# Patient Record
Sex: Female | Born: 1995 | Race: White | Marital: Single | Smoking: Never smoker
Health system: Southern US, Community
[De-identification: ages and names within clinical notes are randomized; demographics above are authoritative.]

---

## 2018-12-02 ENCOUNTER — Ambulatory Visit (HOSPITAL_COMMUNITY)
Admission: EM | Admit: 2018-12-02 | Discharge: 2018-12-02 | Disposition: A | Discharge status: Home / Self Care | Payer: PRIVATE HEALTH INSURANCE | Attending: Emergency Medicine | Admitting: Emergency Medicine

## 2018-12-02 ENCOUNTER — Other Ambulatory Visit: Payer: Self-pay

## 2018-12-02 ENCOUNTER — Encounter (HOSPITAL_COMMUNITY): Payer: Self-pay

## 2018-12-02 DIAGNOSIS — H6983 Other specified disorders of Eustachian tube, bilateral: Secondary | ICD-10-CM

## 2018-12-02 MED ORDER — GUAIFENESIN ER 600 MG PO TB12: 1200.0000 mg | ORAL_TABLET | Freq: Two times a day (BID) | ORAL | 0 refills | Status: DC | PRN

## 2018-12-02 NOTE — ED Triage Notes (Signed)
Pt present that her symptoms is not really dizziness but when she is sitting or laying she feeling like her body is still in motion.

## 2018-12-02 NOTE — ED Provider Notes (Signed)
[place]    CSN: [id number] Arrival date & time: 12/02/18  1316      History   Chief Complaint Chief Complaint  Patient presents with  . Dizziness    HPI Haley Dean is a 23 y.o. female.   Patient presents with a feeling of motion when she lies down.  She describes it as "the feeling you get when you step off a boat".  She denies dizziness, headache, weakness, ear pain, sore throat, cough, congestion, shortness of breath, or other symptoms she states she feels well other than this symptom.  LMP: 11/16/2018.  The history is provided by the patient.    History reviewed. No pertinent past medical history.  There are no active problems to display for this patient.   History reviewed. No pertinent surgical history.  OB History   No obstetric history on file.      Home Medications    Prior to Admission medications   Medication Sig Start Date End Date Taking? Authorizing Provider  guaiFENesin (MUCINEX) 600 MG 12 hr tablet Take 2 tablets (1,200 mg total) by mouth 2 (two) times daily as needed. 12/02/18   Sharion Balloon, NP    Family History History reviewed. No pertinent family history.  Social History Social History   Tobacco Use  . Smoking status: Never Smoker  . Smokeless tobacco: Never Used  Substance Use Topics  . Alcohol use: Not on file  . Drug use: Not on file     Allergies   Amoxicillin   Review of Systems Review of Systems  Constitutional: Negative for chills and fever.  HENT: Negative for congestion, ear pain, rhinorrhea and sore throat.   Eyes: Negative for pain and visual disturbance.  Respiratory: Negative for cough and shortness of breath.   Cardiovascular: Negative for chest pain and palpitations.  Gastrointestinal: Negative for abdominal pain and vomiting.  Genitourinary: Negative for dysuria and hematuria.  Musculoskeletal: Negative for arthralgias and back pain.  Skin: Negative for color change and rash.   Neurological: Negative for dizziness, seizures, syncope, speech difficulty, weakness, numbness and headaches.  All other systems reviewed and are negative.    Physical Exam Triage Vital Signs ED Triage Vitals  Enc Vitals Group     BP 12/02/18 1355 116/81     Pulse Rate 12/02/18 1355 92     Resp 12/02/18 1355 16     Temp 12/02/18 1355 98.7 F (37.1 C)     Temp Source 12/02/18 1355 Oral     SpO2 12/02/18 1355 100 %     Weight --      Height --      Head Circumference --      Peak Flow --      Pain Score 12/02/18 1356 0     Pain Loc --      Pain Edu? --      Excl. in [place]? --    No data found.  Updated Vital Signs BP 116/81 (BP Location: Left Arm)   Pulse 92   Temp 98.7 F (37.1 C) (Oral)   Resp 16   LMP 11/16/2018   SpO2 100%   Visual Acuity Right Eye Distance:   Left Eye Distance:   Bilateral Distance:    Right Eye Near:   Left Eye Near:    Bilateral Near:     Physical Exam Vitals signs and nursing note reviewed.  Constitutional:      General: She is not in acute distress.  Appearance: She is well-developed.  HENT:     Head: Normocephalic and atraumatic.     Right Ear: Tympanic membrane and ear canal normal.     Left Ear: Tympanic membrane and ear canal normal.     Ears:     Comments: Fluid bubbles behind both TMs.     Nose: Nose normal.     Mouth/Throat:     Mouth: Mucous membranes are moist.     Pharynx: Oropharynx is clear.  Eyes:     Conjunctiva/sclera: Conjunctivae normal.  Neck:     Musculoskeletal: Neck supple.  Cardiovascular:     Rate and Rhythm: Normal rate and regular rhythm.     Heart sounds: No murmur.  Pulmonary:     Effort: Pulmonary effort is normal. No respiratory distress.     Breath sounds: Normal breath sounds.  Abdominal:     General: Bowel sounds are normal.     Palpations: Abdomen is soft.     Tenderness: There is no abdominal tenderness. There is no guarding or rebound.  Skin:    General: Skin is warm and dry.      Findings: No rash.  Neurological:     General: No focal deficit present.     Mental Status: She is alert and oriented to person, place, and time.     Cranial Nerves: No cranial nerve deficit.     Sensory: No sensory deficit.     Motor: No weakness.     Coordination: Coordination normal.     Deep Tendon Reflexes: Reflexes normal.  Psychiatric:        Mood and Affect: Mood normal.        Behavior: Behavior normal.      UC Treatments / Results  Labs (all labs ordered are listed, but only abnormal results are displayed) Labs Reviewed - No data to display  EKG   Radiology No results found.  Procedures Procedures (including critical care time)  Medications Ordered in UC Medications - No data to display  Initial Impression / Assessment and Plan / UC Course  I have reviewed the triage vital signs and the nursing notes.  Pertinent labs & imaging results that were available during my care of the patient were reviewed by me and considered in my medical decision making (see chart for details).    Eustachian tube dysfunction.  Treating with Mucinex.  Discussed safety concerns for changing positions with patient.  Instructed her to return here or follow-up with her PCP if her symptoms are not improving or she develops new symptoms such as frank dizziness, weakness, headaches, ear pain, congestion, or other concerns.  Patient agrees to plan of care.     Final Clinical Impressions(s) / UC Diagnoses   Final diagnoses:  Dysfunction of both eustachian tubes     Discharge Instructions     Take the Mucinex as directed.    Change positions slowly, especially when standing up.    Return here or follow-up with your primary care provider if your symptoms are not improving; or you develop new symptoms such as dizziness, headaches, ear pain, congestion, or other concerns.        ED Prescriptions    Medication Sig Dispense Auth. Provider   guaiFENesin (MUCINEX) 600 MG 12 hr tablet  Take 2 tablets (1,200 mg total) by mouth 2 (two) times daily as needed. 12 tablet Mickie Bail, NP     PDMP not reviewed this encounter.   Mickie Bail, NP 12/02/18 1438

## 2018-12-02 NOTE — Discharge Instructions (Signed)
Take the Mucinex as directed.    Change positions slowly, especially when standing up.    Return here or follow-up with your primary care provider if your symptoms are not improving; or you develop new symptoms such as dizziness, headaches, ear pain, congestion, or other concerns.

## 2018-12-19 ENCOUNTER — Ambulatory Visit: Payer: PRIVATE HEALTH INSURANCE | Admitting: Internal Medicine

## 2018-12-21 ENCOUNTER — Ambulatory Visit: Payer: PRIVATE HEALTH INSURANCE | Admitting: Family Medicine

## 2018-12-21 ENCOUNTER — Other Ambulatory Visit: Payer: Self-pay

## 2018-12-21 ENCOUNTER — Ambulatory Visit (INDEPENDENT_AMBULATORY_CARE_PROVIDER_SITE_OTHER): Payer: PRIVATE HEALTH INSURANCE | Admitting: Family Medicine

## 2018-12-21 ENCOUNTER — Encounter: Payer: Self-pay | Admitting: Family Medicine

## 2018-12-21 VITALS — BP 110/70 | HR 82 | Temp 98.4°F | Ht 61.0 in | Wt 153.6 lb

## 2018-12-21 DIAGNOSIS — Z23 Encounter for immunization: Secondary | ICD-10-CM | POA: Diagnosis not present

## 2018-12-21 DIAGNOSIS — H6983 Other specified disorders of Eustachian tube, bilateral: Secondary | ICD-10-CM

## 2018-12-21 NOTE — Progress Notes (Signed)
Haley Dean is a 23 y.o. female  Chief Complaint  Patient presents with  . Establish Care    pt been having some slight pain in boths ears at different times.    HPI: Haley Dean is a 23 y.o. female to establish care with our office. She moved from [place] in 10/2018 to start grad school at [employer].  She complains of B/L ear pain that is intermittent, lasts a few min. She states it feels like her ears need to pop but won't. occasional lightheadedness. Symptoms x few weeks.  No headache. No fever, chills. No nasal congestion, runny nose. No ear drainage.  She was seen at [facility] for this and was recommended to take mucinex. Pt did this for 5 days and felt like symptoms were improving but then she stopped the med.    No past medical history on file.  No past surgical history on file.  Social History   Socioeconomic History  . Marital status: Unknown    Spouse name: Not on file  . Number of children: Not on file  . Years of education: Not on file  . Highest education level: Not on file  Occupational History  . Not on file  Social Needs  . Financial resource strain: Not on file  . Food insecurity    Worry: Not on file    Inability: Not on file  . Transportation needs    Medical: Not on file    Non-medical: Not on file  Tobacco Use  . Smoking status: Never Smoker  . Smokeless tobacco: Never Used  Substance and Sexual Activity  . Alcohol use: Yes    Alcohol/week: 1.0 standard drinks    Types: 1 Glasses of wine per week  . Drug use: Never  . Sexual activity: Not Currently  Lifestyle  . Physical activity    Days per week: Not on file    Minutes per session: Not on file  . Stress: Not on file  Relationships  . Social Herbalist on phone: Not on file    Gets together: Not on file    Attends religious service: Not on file    Active member of club or organization: Not on file    Attends meetings of clubs or organizations: Not on file    Relationship  status: Not on file  . Intimate partner violence    Fear of current or ex partner: Not on file    Emotionally abused: Not on file    Physically abused: Not on file    Forced sexual activity: Not on file  Other Topics Concern  . Not on file  Social History Narrative  . Not on file    No family history on file.   Immunization History  Administered Date(s) Administered  . Influenza,inj,Quad PF,6+ Mos 12/21/2018    Outpatient Encounter Medications as of 12/21/2018  Medication Sig  . guaiFENesin (MUCINEX) 600 MG 12 hr tablet Take 2 tablets (1,200 mg total) by mouth 2 (two) times daily as needed. (Patient not taking: Reported on 12/21/2018)   No facility-administered encounter medications on file as of 12/21/2018.      ROS: Pertinent positives and negatives noted in HPI. Remainder of ROS non-contributory  Allergies  Allergen Reactions  . Amoxicillin Rash    BP 110/70   Pulse 82   Temp 98.4 F (36.9 C) (Oral)   Ht 5\' 1"  (1.549 m)   Wt 153 lb 9.6 oz (69.7 kg)   SpO2  98%   BMI 29.02 kg/m   Physical Exam  Constitutional: She appears well-developed and well-nourished. No distress.  HENT:  Head: Normocephalic and atraumatic.  Right Ear: Tympanic membrane, external ear and ear canal normal. No drainage. Right ear middle ear effusion: serous effusion B/L Rt>Lt.  Left Ear: Tympanic membrane, external ear and ear canal normal. No drainage.  Nose: Nose normal. Right sinus exhibits no maxillary sinus tenderness and no frontal sinus tenderness. Left sinus exhibits no maxillary sinus tenderness and no frontal sinus tenderness.  Mouth/Throat: Oropharynx is clear and moist and mucous membranes are normal.  Neck: Neck supple.  Lymphadenopathy:    She has no cervical adenopathy.     A/P:  1. Eustachian tube dysfunction, bilateral - flonase BID x 10-14 days - sudafed daily x 3-5 days - f/u if symptoms worsen or do not improve in 2 wks Discussed plan and reviewed medications  with patient, including risks, benefits, and potential side effects. Pt expressed understand. All questions answered.

## 2018-12-21 NOTE — Patient Instructions (Signed)
Sudafed x 3-5 days Flonase 2 sprays each side of your nose twice per day for 10-14 days

## 2018-12-27 ENCOUNTER — Ambulatory Visit: Payer: PRIVATE HEALTH INSURANCE | Admitting: Family Medicine

## 2018-12-27 ENCOUNTER — Encounter: Payer: Self-pay | Admitting: Family Medicine

## 2018-12-27 DIAGNOSIS — H6983 Other specified disorders of Eustachian tube, bilateral: Secondary | ICD-10-CM

## 2018-12-27 MED ORDER — PREDNISONE 20 MG PO TABS: 40.0000 mg | ORAL_TABLET | Freq: Every day | ORAL | 0 refills | Status: AC

## 2019-01-02 ENCOUNTER — Other Ambulatory Visit: Payer: Self-pay

## 2019-01-02 ENCOUNTER — Other Ambulatory Visit: Payer: Self-pay | Admitting: Family Medicine

## 2019-01-02 DIAGNOSIS — H6983 Other specified disorders of Eustachian tube, bilateral: Secondary | ICD-10-CM

## 2019-01-02 DIAGNOSIS — H6993 Unspecified Eustachian tube disorder, bilateral: Secondary | ICD-10-CM

## 2019-01-02 MED ORDER — MECLIZINE HCL 25 MG PO TABS: 25.0000 mg | ORAL_TABLET | Freq: Three times a day (TID) | ORAL | 0 refills | Status: DC | PRN

## 2019-02-28 ENCOUNTER — Encounter: Payer: Self-pay | Admitting: Family Medicine

## 2019-03-15 ENCOUNTER — Encounter: Payer: Self-pay | Admitting: Family Medicine

## 2019-03-15 DIAGNOSIS — R519 Headache, unspecified: Secondary | ICD-10-CM

## 2019-03-15 DIAGNOSIS — R42 Dizziness and giddiness: Secondary | ICD-10-CM

## 2019-03-19 ENCOUNTER — Other Ambulatory Visit: Payer: Self-pay

## 2019-03-19 ENCOUNTER — Other Ambulatory Visit (INDEPENDENT_AMBULATORY_CARE_PROVIDER_SITE_OTHER): Payer: PRIVATE HEALTH INSURANCE

## 2019-03-19 DIAGNOSIS — R519 Headache, unspecified: Secondary | ICD-10-CM | POA: Diagnosis not present

## 2019-03-19 DIAGNOSIS — R42 Dizziness and giddiness: Secondary | ICD-10-CM | POA: Diagnosis not present

## 2019-03-19 LAB — CBC WITH DIFFERENTIAL/PLATELET
Basophils Absolute: 0.1 10*3/uL (ref 0.0–0.1)
Basophils Relative: 0.5 % (ref 0.0–3.0)
Eosinophils Absolute: 0.1 10*3/uL (ref 0.0–0.7)
Eosinophils Relative: 0.6 % (ref 0.0–5.0)
HCT: 38.8 % (ref 36.0–46.0)
Hemoglobin: 12.9 g/dL (ref 12.0–15.0)
Lymphocytes Relative: 20.8 % (ref 12.0–46.0)
Lymphs Abs: 2.2 10*3/uL (ref 0.7–4.0)
MCHC: 33.4 g/dL (ref 30.0–36.0)
MCV: 92.6 fl (ref 78.0–100.0)
Monocytes Absolute: 0.5 10*3/uL (ref 0.1–1.0)
Monocytes Relative: 4.9 % (ref 3.0–12.0)
Neutro Abs: 7.6 10*3/uL (ref 1.4–7.7)
Neutrophils Relative %: 73.2 % (ref 43.0–77.0)
Platelets: 295 10*3/uL (ref 150.0–400.0)
RBC: 4.19 Mil/uL (ref 3.87–5.11)
RDW: 12.9 % (ref 11.5–15.5)
WBC: 10.4 10*3/uL (ref 4.0–10.5)

## 2019-03-19 LAB — BASIC METABOLIC PANEL
BUN: 12 mg/dL (ref 6–23)
CO2: 26 mEq/L (ref 19–32)
Calcium: 9.5 mg/dL (ref 8.4–10.5)
Chloride: 103 mEq/L (ref 96–112)
Creatinine, Ser: 0.64 mg/dL (ref 0.40–1.20)
GFR: 115.04 mL/min (ref >60.00)
Glucose, Bld: 86 mg/dL (ref 70–99)
Potassium: 3.8 mEq/L (ref 3.5–5.1)
Sodium: 138 mEq/L (ref 135–145)

## 2019-03-19 LAB — VITAMIN B12: Vitamin B-12: 538 pg/mL (ref 211–911)

## 2019-03-19 LAB — TSH: TSH: 0.58 u[IU]/mL (ref 0.35–4.50)

## 2019-03-20 LAB — IRON,TIBC AND FERRITIN PANEL
%SAT: 31 % (calc) (ref 16–45)
Ferritin: 51 ng/mL (ref 16–154)
Iron: 96 ug/dL (ref 40–190)
TIBC: 310 mcg/dL (calc) (ref 250–450)

## 2019-03-21 ENCOUNTER — Telehealth (INDEPENDENT_AMBULATORY_CARE_PROVIDER_SITE_OTHER): Payer: PRIVATE HEALTH INSURANCE | Admitting: Family Medicine

## 2019-03-21 ENCOUNTER — Encounter: Payer: Self-pay | Admitting: Family Medicine

## 2019-03-21 VITALS — Temp 97.9°F

## 2019-03-21 DIAGNOSIS — R12 Heartburn: Secondary | ICD-10-CM | POA: Diagnosis not present

## 2019-03-21 DIAGNOSIS — R42 Dizziness and giddiness: Secondary | ICD-10-CM

## 2019-03-21 NOTE — Progress Notes (Signed)
Virtual Visit via Video Note  I connected with Haley Dean on 03/21/19 at  2:00 PM EST by a video enabled telemedicine application and verified that I am speaking with the correct person using two identifiers. Location patient: home Location provider: work or home office Persons participating in the virtual visit: patient, provider  I discussed the limitations of evaluation and management by telemedicine and the availability of in person appointments. The patient expressed understanding and agreed to proceed.  Chief Complaint  Patient presents with  . Heartburn    Patient agrees to virtual visit. She is C/O heartburn that started on Saturday.  It dissapated and returned on Monday and stayed into Tuesday. She states that she has burning in epigastric area that intermittently went through to her back. She denies neck, jaw, arm pain but has had lightheadedness since September. Had some heartburn prior to Christmas and she Tx with Prevacid and it seemed to help.     HPI: Haley Dean is a 24 y.o. female with heartburn - burning sensation is upper abdomen and lower chest - that has occurred intermittently over the past 4 days. She had similar episode just prior to Christmas. No n/v/d/c. No fever, chills. She cannot identify a specific trigger for the event. She took a few doses of prevacid in 02/2019 and that seemed to be effective. She tried pepto-bismol without much change.   Pt continues to have lightheadedness that is intermittent, occurs at rest (not with movement, position change, ambulation). Symptoms began 4 mo ago. 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 It can also occur while she is seated watching TV. No associated symptoms - denies HA, n/v, vision changes, CP, SOB, palpitations. She does wear glasses and had eye exam in 07/2018. She gets 8+ hrs of sleep per night, eats well, and  feels she drinks plenty of water. Symptoms were thought to be d/t eustachian tube dysfunction but pt has been using flonase, saw ENT and audiology, and was told symptoms are not ENT related. Pt is concerned about cause of symptoms.    No past medical history on file.  No past surgical history on file.  No family history on file.  Social History   Tobacco Use  . Smoking status: Never Smoker  . Smokeless tobacco: Never Used  Substance Use Topics  . Alcohol use: Yes    Alcohol/week: 1.0 standard drinks    Types: 1 Glasses of wine per week  . Drug use: Never     Current Outpatient Medications:  .  fluticasone (FLONASE) 50 MCG/ACT nasal spray, Place into the nose., Disp: , Rfl:   Allergies  Allergen Reactions  . Amoxicillin Rash      ROS: See pertinent positives and negatives per HPI.   EXAM:  VITALS per patient if applicable: Temp 97.9 F (36.6 C) (Oral)   LMP 03/07/2019     GENERAL: alert, oriented, appears well and in no acute distress  HEENT: atraumatic, conjunctiva clear, no obvious abnormalities on inspection of external nose and ears  NECK: normal movements of the head and neck  LUNGS: on inspection no signs of respiratory distress, breathing rate appears normal, no obvious gross SOB, gasping or wheezing, no conversational dyspnea  CV: no obvious cyanosis  PSYCH/NEURO: pleasant and cooperative,  speech and thought processing grossly intact   ASSESSMENT AND PLAN:  1. Heartburn - episodic, no red flag symptoms -  keep food log to try to identify trigger foods - PRN antacid or daily PPI x 1-2 wks if antacid not effective - f/u PRN  2. Lightheadedness 3. Dizziness, nonspecific - consistent symptoms x 4 mo - VAS US CAROTID; Future - CT Head Wo Contrast; Future - CT Maxillofacial WO CM; Future - normal BMP, TSH, CBC, iron panel, B12 - ENT/audiology eval - normal - vision WNL and eye exam UTD; appropriate sleep, hydration, nutrition - could be stress  related but will r/o CV, neuro pathology    I discussed the assessment and treatment plan with the patient. The patient was provided an opportunity to ask questions and all were answered. The patient agreed with the plan and demonstrated an understanding of the instructions.   The patient was advised to call back or seek an in-person evaluation if the symptoms worsen or if the condition fails to improve as anticipated.   Letta Median, DO

## 2019-03-22 ENCOUNTER — Other Ambulatory Visit: Payer: Self-pay

## 2019-03-22 ENCOUNTER — Ambulatory Visit (HOSPITAL_COMMUNITY)
Admission: RE | Admit: 2019-03-22 | Discharge: 2019-03-22 | Disposition: A | Discharge status: Home / Self Care | Payer: PRIVATE HEALTH INSURANCE | Source: Ambulatory Visit | Attending: Cardiology | Admitting: Cardiology

## 2019-03-22 DIAGNOSIS — R42 Dizziness and giddiness: Secondary | ICD-10-CM | POA: Diagnosis not present

## 2019-03-29 ENCOUNTER — Other Ambulatory Visit: Payer: Self-pay | Admitting: Family Medicine

## 2019-03-30 ENCOUNTER — Ambulatory Visit
Admission: RE | Admit: 2019-03-30 | Discharge: 2019-03-30 | Disposition: A | Discharge status: Home / Self Care | Payer: PRIVATE HEALTH INSURANCE | Source: Ambulatory Visit | Attending: Family Medicine | Admitting: Family Medicine

## 2019-03-30 DIAGNOSIS — R42 Dizziness and giddiness: Secondary | ICD-10-CM

## 2019-04-02 ENCOUNTER — Encounter: Payer: Self-pay | Admitting: Family Medicine

## 2019-04-04 ENCOUNTER — Other Ambulatory Visit: Payer: Self-pay | Admitting: Family Medicine

## 2019-04-04 DIAGNOSIS — H6983 Other specified disorders of Eustachian tube, bilateral: Secondary | ICD-10-CM

## 2019-04-04 DIAGNOSIS — R42 Dizziness and giddiness: Secondary | ICD-10-CM

## 2019-04-13 ENCOUNTER — Ambulatory Visit (INDEPENDENT_AMBULATORY_CARE_PROVIDER_SITE_OTHER): Payer: PRIVATE HEALTH INSURANCE | Admitting: Otolaryngology

## 2019-04-13 ENCOUNTER — Other Ambulatory Visit: Payer: Self-pay

## 2019-04-13 ENCOUNTER — Encounter (INDEPENDENT_AMBULATORY_CARE_PROVIDER_SITE_OTHER): Payer: Self-pay | Admitting: Otolaryngology

## 2019-04-13 VITALS — Temp 97.9°F

## 2019-04-13 DIAGNOSIS — R2689 Other abnormalities of gait and mobility: Secondary | ICD-10-CM | POA: Diagnosis not present

## 2019-04-13 DIAGNOSIS — H6983 Other specified disorders of Eustachian tube, bilateral: Secondary | ICD-10-CM

## 2019-04-13 NOTE — Progress Notes (Signed)
HPI: Haley Dean is a 24 y.o. female who presents is referred by Dr. Percell Belt For evaluation of "eustachian tube dysfunction".  Patient has complained of chronic ear pressure since September.  She also states that she has, lightheadedness but no real vertigo or spinning.  She says her ears will not pop.  She has seen another ENT and had audiogram which was normal as well as a CT scan of her head which was normal.  She has had this previously but only lasted for a few weeks.  Her present symptoms have been lasting for several months and she still describes pressure or fullness in the ear and some lightheadedness.  But she denies any vertigo or spinning sensation.  She does not have any difficulty hearing.  No ear pain.  No past medical history on file. No past surgical history on file. Social History   Socioeconomic History  . Marital status: Single    Spouse name: Not on file  . Number of children: Not on file  . Years of education: Not on file  . Highest education level: Not on file  Occupational History  . Not on file  Tobacco Use  . Smoking status: Never Smoker  . Smokeless tobacco: Never Used  Substance and Sexual Activity  . Alcohol use: Yes    Alcohol/week: 1.0 standard drinks    Types: 1 Glasses of wine per week  . Drug use: Never  . Sexual activity: Not Currently  Other Topics Concern  . Not on file  Social History Narrative  . Not on file   Social Determinants of Health   Financial Resource Strain:   . Difficulty of Paying Living Expenses: Not on file  Food Insecurity:   . Worried About Programme researcher, broadcasting/film/video in the Last Year: Not on file  . Ran Out of Food in the Last Year: Not on file  Transportation Needs:   . Lack of Transportation (Medical): Not on file  . Lack of Transportation (Non-Medical): Not on file  Physical Activity:   . Days of Exercise per Week: Not on file  . Minutes of Exercise per Session: Not on file  Stress:   . Feeling of Stress : Not on  file  Social Connections:   . Frequency of Communication with Friends and Family: Not on file  . Frequency of Social Gatherings with Friends and Family: Not on file  . Attends Religious Services: Not on file  . Active Member of Clubs or Organizations: Not on file  . Attends Banker Meetings: Not on file  . Marital Status: Not on file   No family history on file. Allergies  Allergen Reactions  . Amoxicillin Rash   Prior to Admission medications   Medication Sig Start Date End Date Taking? Authorizing Provider  fluticasone (FLONASE) 50 MCG/ACT nasal spray Place into the nose. 01/12/19 01/12/20 Yes [provider]     Positive ROS: Otherwise negative  All other systems have been reviewed and were otherwise negative with the exception of those mentioned in the HPI and as above.  Physical Exam: Constitutional: Alert, well-appearing, no acute distress Ears: External ears without lesions or tenderness. Ear canals are clear bilaterally.  TMs are clear bilaterally with good mobility on pneumatic otoscopy.  No middle ear fluid noted.  On hearing screening with a 512 1024 tuning fork she heard about the same in both ears with AC > BC bilaterally. Nasal: External nose without lesions. Septum with minimal deformity.  Mild rhinitis.  Both middle meatus regions are clear with no signs of infection..  Nasopharynx appears clear. Oral: Lips and gums without lesions. Tongue and palate mucosa without lesions. Posterior oropharynx clear. Neck: No palpable adenopathy or masses.  No obvious TMJ abnormality noted on palpation. Respiratory: Breathing comfortably  Skin: No facial/neck lesions or rash noted.  Procedures  Assessment: Questionable eustachian tube dysfunction.  Normal clinical exam of ears. I suspect the symptoms will gradually resolve or spontaneously resolve without specific therapy although I would recommend continuation with the nasal steroid spray as she has mild  rhinitis.  Plan: I discussed with the patient concerning present clinical findings.  Would recommend continuation with the Flonase. If symptoms with imbalance worsen would recommend further evaluation with neurologist.   Radene Journey, MD   CC:

## 2019-04-16 ENCOUNTER — Encounter (INDEPENDENT_AMBULATORY_CARE_PROVIDER_SITE_OTHER): Payer: Self-pay

## 2019-04-20 ENCOUNTER — Encounter: Payer: Self-pay | Admitting: Family Medicine

## 2019-05-28 ENCOUNTER — Encounter: Payer: Self-pay | Admitting: Family Medicine

## 2019-11-30 ENCOUNTER — Encounter: Payer: Self-pay | Admitting: Family Medicine

## 2019-11-30 ENCOUNTER — Other Ambulatory Visit: Payer: Self-pay

## 2019-11-30 ENCOUNTER — Ambulatory Visit (INDEPENDENT_AMBULATORY_CARE_PROVIDER_SITE_OTHER): Payer: PRIVATE HEALTH INSURANCE | Admitting: Family Medicine

## 2019-11-30 VITALS — BP 108/70 | HR 69 | Temp 98.3°F | Ht 61.0 in | Wt 146.8 lb

## 2019-11-30 DIAGNOSIS — F411 Generalized anxiety disorder: Secondary | ICD-10-CM | POA: Diagnosis not present

## 2019-11-30 DIAGNOSIS — Z23 Encounter for immunization: Secondary | ICD-10-CM

## 2019-11-30 MED ORDER — ESCITALOPRAM OXALATE 10 MG PO TABS: ORAL_TABLET | ORAL | 1 refills | Status: DC

## 2019-11-30 NOTE — Progress Notes (Signed)
Haley Dean is a 24 y.o. female  Chief Complaint  Patient presents with  . Anxiety    anxiety symptoms, was recommended by therapist to f/u with PCP,  having issues eating/vommiting ; wants flu shot today    HPI: Haley Dean is a 24 y.o. female who is seen today to discuss anxiety and possible medication options to better manage her symptoms. She is seeing a therapist - counseling center at [facility] - who recommended she see PCP to discuss meds. Pt had generalized anxiety and at times this manifests physically as GI upset, decreased appetite, vomiting. She also has lightheadedness at times. Majority of stress related to family, sister with mental health issues but unwilling to seek treatment.   History reviewed. No pertinent past medical history.  History reviewed. No pertinent surgical history.  Social History   Socioeconomic History  . Marital status: Single    Spouse name: Not on file  . Number of children: Not on file  . Years of education: Not on file  . Highest education level: Not on file  Occupational History  . Not on file  Tobacco Use  . Smoking status: Never Smoker  . Smokeless tobacco: Never Used  Vaping Use  . Vaping Use: Never used  Substance and Sexual Activity  . Alcohol use: Yes    Alcohol/week: 1.0 standard drink    Types: 1 Glasses of wine per week  . Drug use: Never  . Sexual activity: Not Currently  Other Topics Concern  . Not on file  Social History Narrative  . Not on file   Social Determinants of Health   Financial Resource Strain:   . Difficulty of Paying Living Expenses: Not on file  Food Insecurity:   . Worried About Programme researcher, broadcasting/film/video in the Last Year: Not on file  . Ran Out of Food in the Last Year: Not on file  Transportation Needs:   . Lack of Transportation (Medical): Not on file  . Lack of Transportation (Non-Medical): Not on file  Physical Activity:   . Days of Exercise per Week: Not on file  . Minutes of Exercise  per Session: Not on file  Stress:   . Feeling of Stress : Not on file  Social Connections:   . Frequency of Communication with Friends and Family: Not on file  . Frequency of Social Gatherings with Friends and Family: Not on file  . Attends Religious Services: Not on file  . Active Member of Clubs or Organizations: Not on file  . Attends Banker Meetings: Not on file  . Marital Status: Not on file  Intimate Partner Violence:   . Fear of Current or Ex-Partner: Not on file  . Emotionally Abused: Not on file  . Physically Abused: Not on file  . Sexually Abused: Not on file    History reviewed. No pertinent family history.   Immunization History  Administered Date(s) Administered  . Influenza,inj,Quad PF,6+ Mos 12/21/2018  . Janssen (J&J) SARS-COV-2 Vaccination 05/17/2019    Outpatient Encounter Medications as of 11/30/2019  Medication Sig  . Fexofenadine HCl (ALLEGRA ALLERGY PO) Take by mouth.  . fluticasone (FLONASE) 50 MCG/ACT nasal spray Place into the nose. (Patient not taking: Reported on 11/30/2019)   No facility-administered encounter medications on file as of 11/30/2019.     ROS: Pertinent positives and negatives noted in HPI. Remainder of ROS non-contributory    Allergies  Allergen Reactions  . Amoxicillin Rash    BP 108/70  Pulse 69   Temp 98.3 F (36.8 C) (Temporal)   Ht 5\' 1"  (1.549 m)   Wt 146 lb 12.8 oz (66.6 kg)   SpO2 96%   BMI 27.74 kg/m   Physical Exam Constitutional:      General: She is not in acute distress.    Appearance: Normal appearance. She is not ill-appearing.  Pulmonary:     Effort: No respiratory distress.  Neurological:     Mental Status: She is alert and oriented to person, place, and time.  Psychiatric:        Mood and Affect: Mood normal.        Behavior: Behavior normal.      A/P:  1. GAD (generalized anxiety disorder) - cont with regular BH counseling thru [facility] Rx: - escitalopram (LEXAPRO) 10 MG  tablet; 1/2 tab po daily x 4 days then 1 tab po daily  Dispense: 90 tablet; Refill: 1 - f/u in 3-4 wks or sooner PRN - all questions answered  Discussed plan and reviewed medications with patient, including risks, benefits, and potential side effects. Pt expressed understand. All questions answered.  2. Need for influenza vaccination - Flu Vaccine QUAD 6+ mos PF IM (Fluarix Quad PF)   I spent 30 min with the patient today obtaining HPI, discussing diagnosis and treatment options and f/u.   This visit occurred during the SARS-CoV-2 public health emergency.  Safety protocols were in place, including screening questions prior to the visit, additional usage of staff PPE, and extensive cleaning of exam room while observing appropriate contact time as indicated for disinfecting solutions.

## 2019-12-05 ENCOUNTER — Ambulatory Visit: Payer: PRIVATE HEALTH INSURANCE | Admitting: Family Medicine

## 2019-12-21 ENCOUNTER — Other Ambulatory Visit: Payer: Self-pay

## 2019-12-21 ENCOUNTER — Encounter: Payer: Self-pay | Admitting: Family Medicine

## 2019-12-21 ENCOUNTER — Ambulatory Visit (INDEPENDENT_AMBULATORY_CARE_PROVIDER_SITE_OTHER): Payer: PRIVATE HEALTH INSURANCE | Admitting: Family Medicine

## 2019-12-21 VITALS — BP 110/68 | HR 63 | Temp 98.4°F | Ht 61.0 in | Wt 142.4 lb

## 2019-12-21 DIAGNOSIS — F411 Generalized anxiety disorder: Secondary | ICD-10-CM | POA: Diagnosis not present

## 2019-12-21 MED ORDER — ESCITALOPRAM OXALATE 20 MG PO TABS: 20.0000 mg | ORAL_TABLET | Freq: Every day | ORAL | 3 refills | Status: DC

## 2019-12-21 NOTE — Progress Notes (Signed)
Haley Dean is a 24 y.o. female  Chief Complaint  Patient presents with  . Follow-up    3 week f/u anxiety    HPI: Haley Dean is a 24 y.o. female seen today for f/u on anxiety. She was started on lexapro 10mg  daily on 11/30/19. Today she reports she hasn't noticed much of a difference. She is taking 10mg  daily at bedtime. No side effects.  PHQ-9 = 9, GAD-7 = 9  No past medical history on file.  No past surgical history on file.  Social History   Socioeconomic History  . Marital status: Single    Spouse name: Not on file  . Number of children: Not on file  . Years of education: Not on file  . Highest education level: Not on file  Occupational History  . Not on file  Tobacco Use  . Smoking status: Never Smoker  . Smokeless tobacco: Never Used  Vaping Use  . Vaping Use: Never used  Substance and Sexual Activity  . Alcohol use: Yes    Alcohol/week: 1.0 standard drink    Types: 1 Glasses of wine per week  . Drug use: Never  . Sexual activity: Not Currently  Other Topics Concern  . Not on file  Social History Narrative  . Not on file   Social Determinants of Health   Financial Resource Strain:   . Difficulty of Paying Living Expenses: Not on file  Food Insecurity:   . Worried About 12/02/19 in the Last Year: Not on file  . Ran Out of Food in the Last Year: Not on file  Transportation Needs:   . Lack of Transportation (Medical): Not on file  . Lack of Transportation (Non-Medical): Not on file  Physical Activity:   . Days of Exercise per Week: Not on file  . Minutes of Exercise per Session: Not on file  Stress:   . Feeling of Stress : Not on file  Social Connections:   . Frequency of Communication with Friends and Family: Not on file  . Frequency of Social Gatherings with Friends and Family: Not on file  . Attends Religious Services: Not on file  . Active Member of Clubs or Organizations: Not on file  . Attends  Meetings: Not on file  . Marital Status: Not on file  Intimate Partner Violence:   . Fear of Current or Ex-Partner: Not on file  . Emotionally Abused: Not on file  . Physically Abused: Not on file  . Sexually Abused: Not on file    No family history on file.   Immunization History  Administered Date(s) Administered  . Influenza,inj,Quad PF,6+ Mos 12/21/2018, 11/30/2019  . Janssen (J&J) SARS-COV-2 Vaccination 05/17/2019    Outpatient Encounter Medications as of 12/21/2019  Medication Sig  . escitalopram (LEXAPRO) 20 MG tablet Take 1 tablet (20 mg total) by mouth daily.  07/17/2019 Fexofenadine HCl (ALLEGRA ALLERGY PO) Take by mouth.  . [DISCONTINUED] escitalopram (LEXAPRO) 10 MG tablet 1/2 tab po daily x 4 days then 1 tab po daily  . [DISCONTINUED] fluticasone (FLONASE) 50 MCG/ACT nasal spray Place into the nose. (Patient not taking: Reported on 11/30/2019)   No facility-administered encounter medications on file as of 12/21/2019.     ROS: Pertinent positives and negatives noted in HPI. Remainder of ROS non-contributory    Allergies  Allergen Reactions  . Amoxicillin Rash    BP 110/68   Pulse 63   Temp 98.4 F (36.9 C) (Temporal)  Ht 5\' 1"  (1.549 m)   Wt 142 lb 6.4 oz (64.6 kg)   SpO2 98%   BMI 26.91 kg/m   Physical Exam Constitutional:      General: She is not in acute distress.    Appearance: Normal appearance. She is not ill-appearing.  Pulmonary:     Effort: No respiratory distress.  Neurological:     Mental Status: She is alert and oriented to person, place, and time.  Psychiatric:        Mood and Affect: Mood normal.        Behavior: Behavior normal.      A/P:  1. GAD (generalized anxiety disorder) - no noticeable change in symptoms on lexapro 10mg  daily - PHQ-9 = 9, GAD-7 = 9 Increase: - escitalopram (LEXAPRO) 20 MG tablet; Take 1 tablet (20 mg total) by mouth daily.  Dispense: 90 tablet; Refill: 3 - f/u via VV or mychart message in 2 wks or sooner  PRN Discussed plan and reviewed medications with patient, including risks, benefits, and potential side effects. Pt expressed understand. All questions answered.   This visit occurred during the SARS-CoV-2 public health emergency.  Safety protocols were in place, including screening questions prior to the visit, additional usage of staff PPE, and extensive cleaning of exam room while observing appropriate contact time as indicated for disinfecting solutions.

## 2019-12-24 ENCOUNTER — Other Ambulatory Visit: Payer: Self-pay

## 2019-12-24 DIAGNOSIS — F411 Generalized anxiety disorder: Secondary | ICD-10-CM

## 2019-12-24 MED ORDER — ESCITALOPRAM OXALATE 20 MG PO TABS: 20.0000 mg | ORAL_TABLET | Freq: Every day | ORAL | 3 refills | Status: DC

## 2019-12-24 NOTE — Telephone Encounter (Signed)
Contacted pharmacy to verify they didn't received RX then resent to pharmacy due to E-scribe error on 12/21/19.   Dm/cma 

## 2019-12-28 ENCOUNTER — Encounter: Payer: Self-pay | Admitting: Family Medicine

## 2020-01-04 ENCOUNTER — Telehealth (INDEPENDENT_AMBULATORY_CARE_PROVIDER_SITE_OTHER): Payer: PRIVATE HEALTH INSURANCE | Admitting: Family Medicine

## 2020-01-04 ENCOUNTER — Encounter: Payer: Self-pay | Admitting: Family Medicine

## 2020-01-04 VITALS — Ht 61.0 in | Wt 146.0 lb

## 2020-01-04 DIAGNOSIS — T50905A Adverse effect of unspecified drugs, medicaments and biological substances, initial encounter: Secondary | ICD-10-CM

## 2020-01-04 DIAGNOSIS — F411 Generalized anxiety disorder: Secondary | ICD-10-CM

## 2020-01-04 MED ORDER — FLUOXETINE HCL 10 MG PO CAPS: 10.0000 mg | ORAL_CAPSULE | Freq: Every day | ORAL | 1 refills | Status: AC

## 2020-01-04 NOTE — Progress Notes (Signed)
Virtual Visit via Video Note  I connected with Haley Dean on 01/04/20 at  1:00 PM EDT by a video enabled telemedicine application and verified that I am speaking with the correct person using two identifiers. Location patient: home Location provider: work  Persons participating in the virtual visit: patient, provider  I discussed the limitations of evaluation and management by telemedicine and the availability of in person appointments. The patient expressed understanding and agreed to proceed.  Chief Complaint  Patient presents with  . Follow-up    f/u anxiety/meds     HPI: Haley Dean is a 24 y.o. female seen today for f/u on anxiety. Pt is taking lexapro and dose was increased to 20mg  on 12/21/19. Pt experienced lightheadedness on this dose which resolved when she stopped taking the medication. Last dose was about 1 week ago.  Pt has not been on any other med for anxiety in the past.   History reviewed. No pertinent past medical history.  History reviewed. No pertinent surgical history.   History reviewed. No pertinent family history.  Social History   Tobacco Use  . Smoking status: Never Smoker  . Smokeless tobacco: Never Used  Vaping Use  . Vaping Use: Never used  Substance Use Topics  . Alcohol use: Yes    Alcohol/week: 1.0 standard drink    Types: 1 Glasses of wine per week  . Drug use: Never     Current Outpatient Medications:  .  Fexofenadine HCl (ALLEGRA ALLERGY PO), Take by mouth., Disp: , Rfl:  .  FLUoxetine (PROZAC) 10 MG capsule, Take 1 capsule (10 mg total) by mouth daily., Disp: 90 capsule, Rfl: 1  Allergies  Allergen Reactions  . Amoxicillin Rash      ROS: See pertinent positives and negatives per HPI.   EXAM:  VITALS per patient if applicable: Ht 5\' 1"  (1.549 m)   Wt 146 lb (66.2 kg) Comment: pt reported  BMI 27.59 kg/m    GENERAL: alert, oriented, appears well and in no acute distress  HEENT: atraumatic, conjunctiva  clear, no obvious abnormalities on inspection of external nose and ears  NECK: normal movements of the head and neck  LUNGS: on inspection no signs of respiratory distress, breathing rate appears normal, no obvious gross SOB, gasping or wheezing, no conversational dyspnea  CV: no obvious cyanosis  MS: moves all visible extremities without noticeable abnormality  PSYCH/NEURO: pleasant and cooperative, no obvious depression or anxiety, speech and thought processing grossly intact   ASSESSMENT AND PLAN: 1. GAD (generalized anxiety disorder) 2. Medication reaction, initial encounter - pt experienced lightheadedness on lexapro 20mg  daily and 10mg  daily was not very effective. Pt d/c'd 1 week ago Rx: - FLUoxetine (PROZAC) 10 MG capsule; Take 1 capsule (10 mg total) by mouth daily.  Dispense: 90 capsule; Refill: 1 - if pt experiences side effects with prozac will switch away from SSRI and try effexor or cymbalta - pt to f/u with mychart message in 3wks or so Discussed plan and reviewed medications with patient, including risks, benefits, and potential side effects. Pt expressed understand. All questions answered.   I discussed the assessment and treatment plan with the patient. The patient was provided an opportunity to ask questions and all were answered. The patient agreed with the plan and demonstrated an understanding of the instructions.   The patient was advised to call back or seek an in-person evaluation if the symptoms worsen or if the condition fails to improve as anticipated.   12/23/19,  DO

## 2021-11-26 IMAGING — CT CT MAXILLOFACIAL W/O CM
3 of 5 series · 13 of 47 positions shown, 15 images · non-contrast
Comparison: None.

CLINICAL DATA: Dizziness, lightheadedness

EXAM:
CT HEAD WITHOUT CONTRAST
CT MAXILLOFACIAL WITHOUT CONTRAST
TECHNIQUE: Multidetector CT imaging of the head and maxillofacial structures
were performed using the standard protocol without intravenous
contrast. Multiplanar CT image reconstructions of the maxillofacial
structures were also generated.

[Series 2: sinus 2.00 hr60 s3 axial · axial · 0.27mm/px · z∈[-644,-560]mm · 7 of 55 slices shown, 9 images]
[im 7/55  brain]
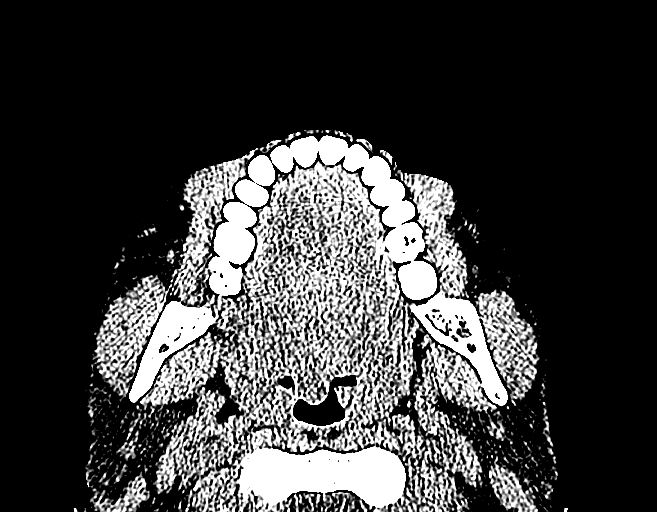
[im 7/55  bone]
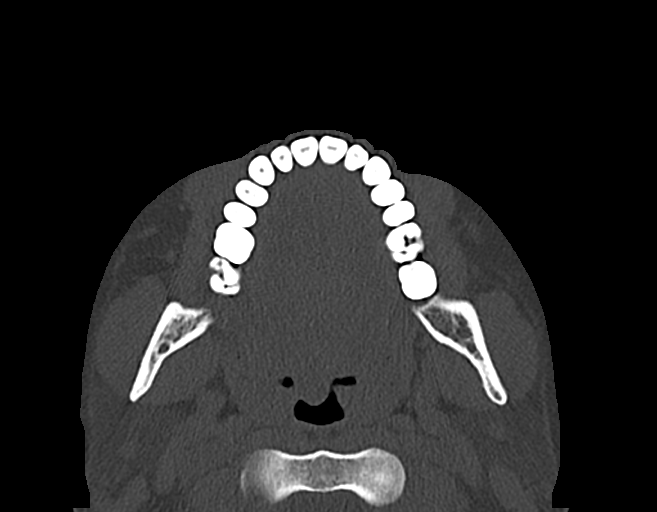
[im 13/55  bone]
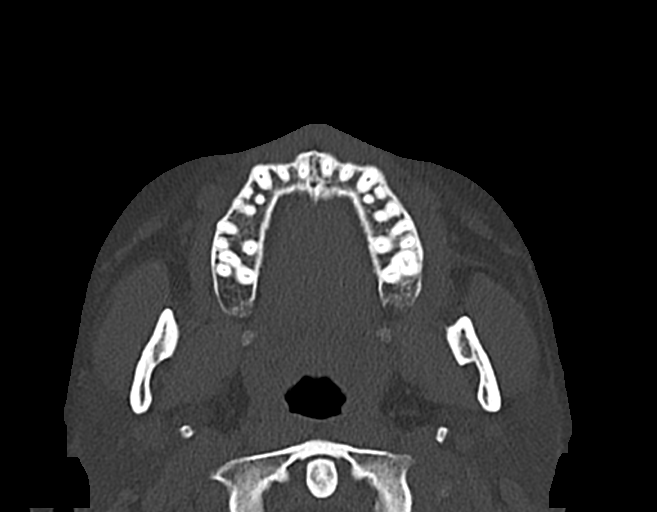
[im 19/55  bone]
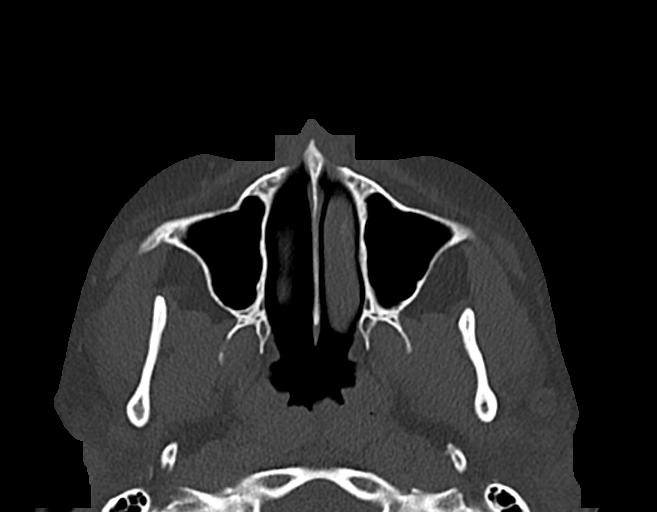
[im 31/55  bone]
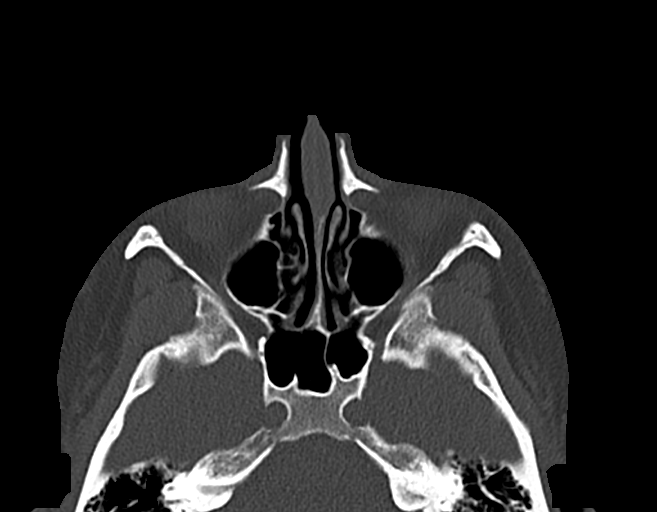
[im 37/55  brain]
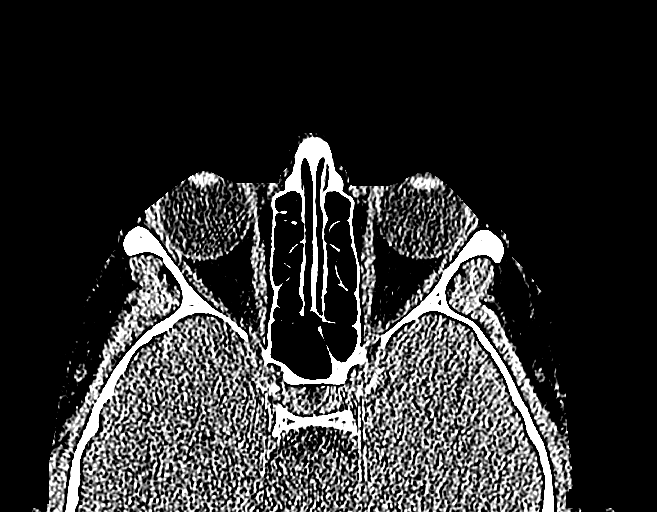
[im 37/55  bone]
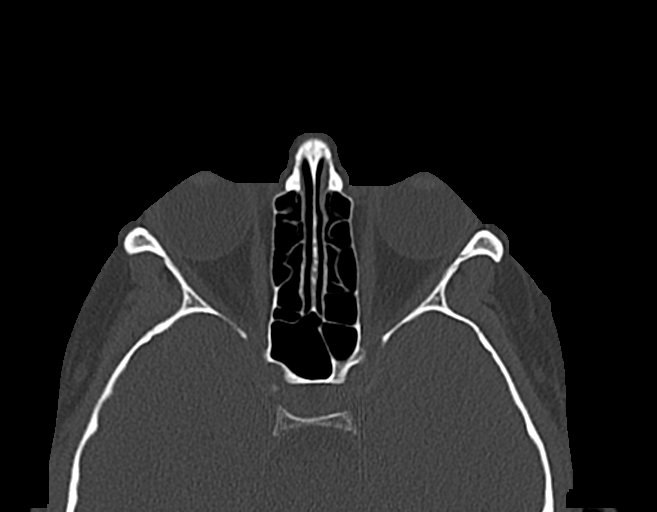
[im 43/55  bone]
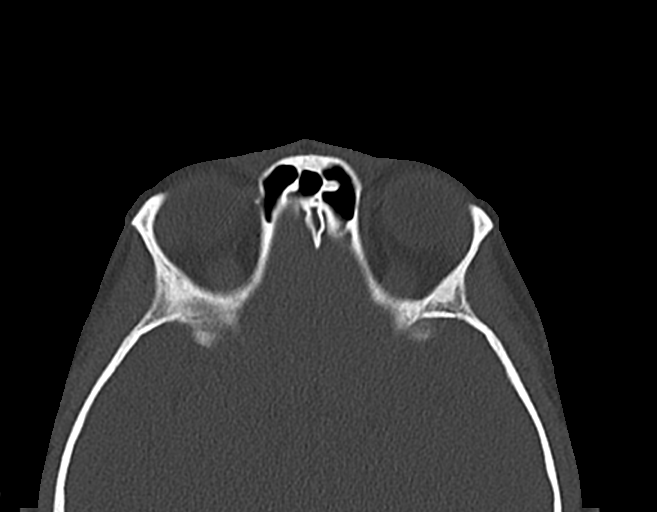
[im 49/55  bone]
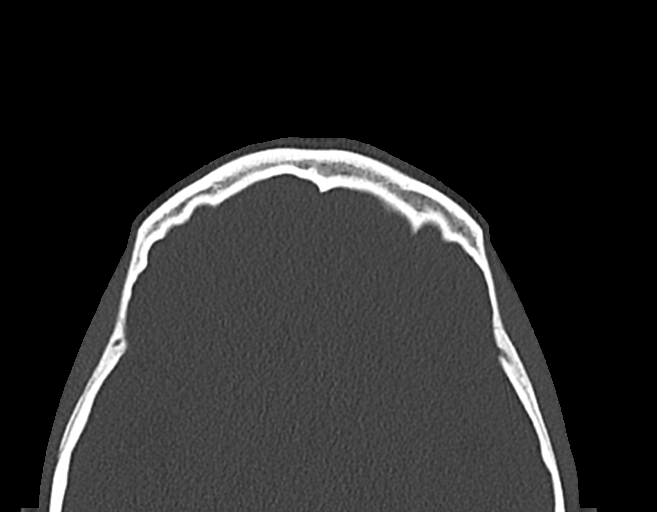

[Series 4: sinus 2.00 hr60 s3 cor · coronal · 0.22mm/px · 3 of 68 slices shown]
[im 23/68  bone]
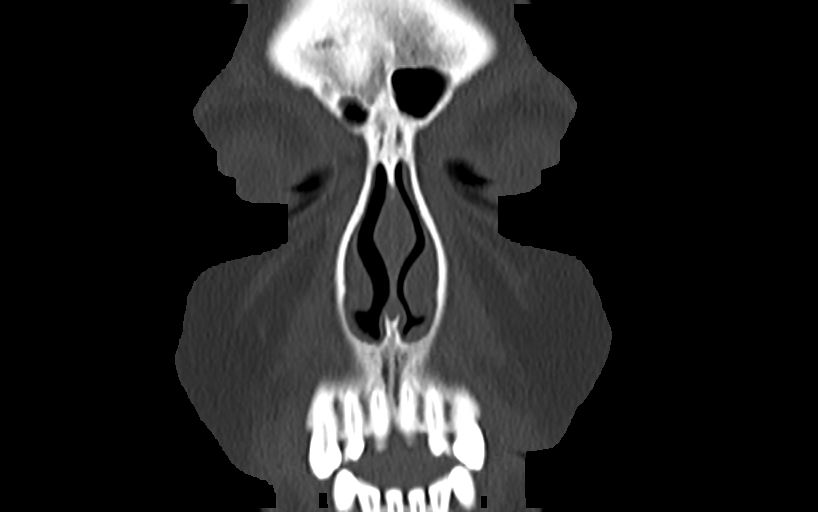
[im 30/68  bone]
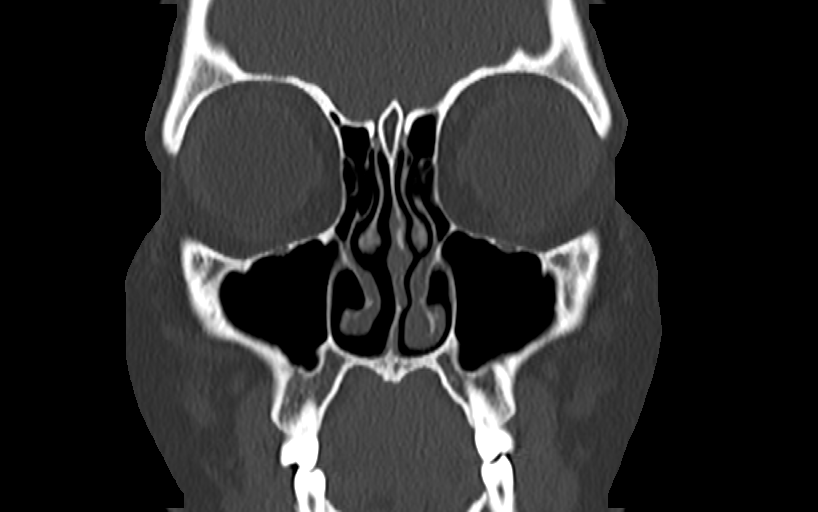
[im 38/68  bone]
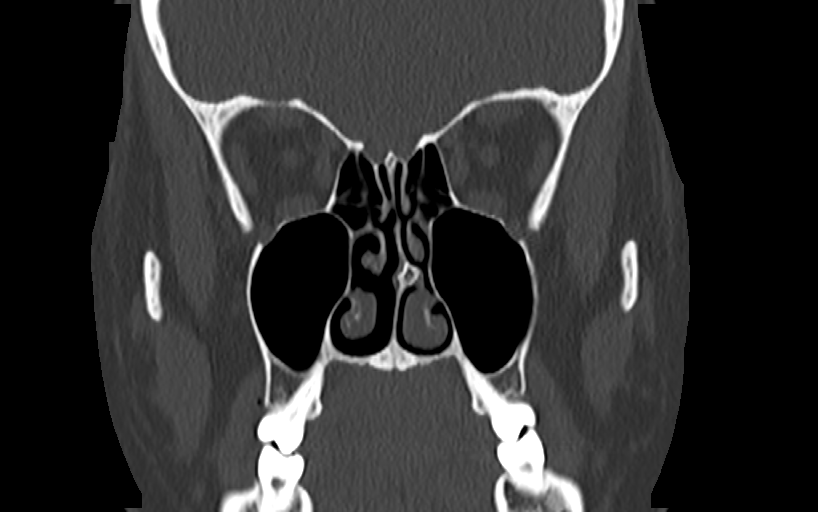

[Series 6: sinus 2.00 hr60 s3 sag · sagittal · 0.22mm/px · 3 of 87 slices shown]
[im 29/87  bone]
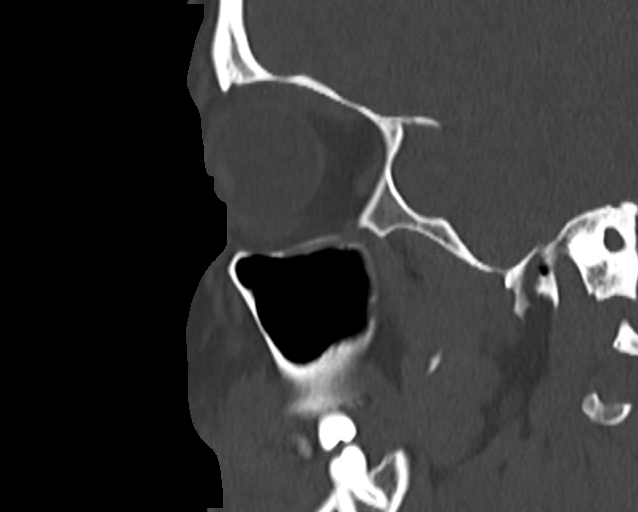
[im 44/87  bone]
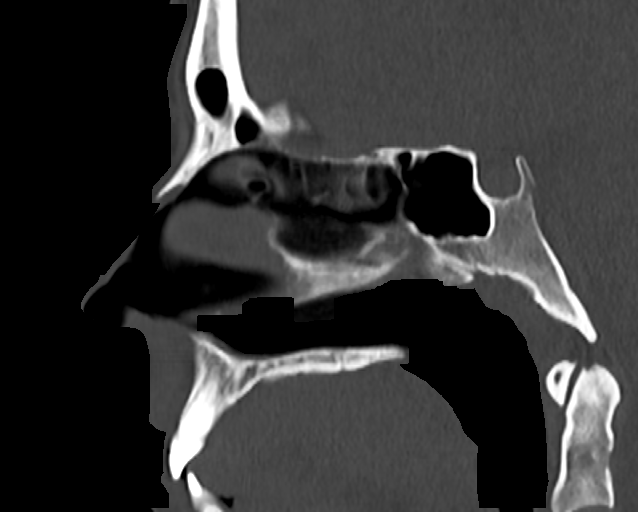
[im 58/87  bone]
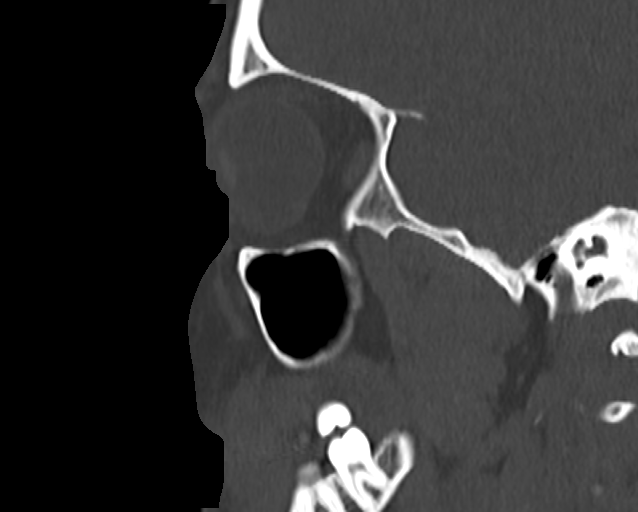

[13 of 47 positions shown; findings below may reference images not displayed]

FINDINGS: CT HEAD FINDINGS

Brain: No evidence of acute infarction, hemorrhage, hydrocephalus,
extra-axial collection or mass lesion/mass effect.

Vascular: No hyperdense vessel or unexpected calcification.

Skull: Normal. Negative for fracture or focal lesion.

Other: None.

CT MAXILLOFACIAL FINDINGS

Osseous: No fracture or mandibular dislocation. No destructive
process.

Orbits: Negative. No traumatic or inflammatory finding.

Sinuses: Clear. Normal aeration of the sinuses. The ostiomeatal
complexes and frontal recesses are patent. No mucosal thickening or
air-fluid levels.

Soft tissues: Negative.
IMPRESSION: 1. No acute intracranial pathology. No non-contrast CT findings to
explain dizziness or lightheadedness.
2. Normal aeration of the sinuses. No mucosal thickening or
air-fluid levels.
